# Patient Record
Sex: Male | Born: 1960 | Race: Black or African American | Hispanic: No | Marital: Married | Smoking: Never smoker
Health system: Southern US, Community
[De-identification: ages and names within clinical notes are randomized; demographics above are authoritative.]

---

## 2017-09-27 ENCOUNTER — Encounter (HOSPITAL_BASED_OUTPATIENT_CLINIC_OR_DEPARTMENT_OTHER): Payer: Self-pay | Admitting: *Deleted

## 2017-09-27 ENCOUNTER — Emergency Department (HOSPITAL_BASED_OUTPATIENT_CLINIC_OR_DEPARTMENT_OTHER)
Admission: EM | Admit: 2017-09-27 | Discharge: 2017-09-27 | Disposition: A | Discharge status: Home / Self Care | Payer: 59 | Attending: Emergency Medicine | Admitting: Emergency Medicine

## 2017-09-27 ENCOUNTER — Other Ambulatory Visit: Payer: Self-pay

## 2017-09-27 ENCOUNTER — Emergency Department (HOSPITAL_BASED_OUTPATIENT_CLINIC_OR_DEPARTMENT_OTHER): Payer: 59

## 2017-09-27 DIAGNOSIS — N200 Calculus of kidney: Secondary | ICD-10-CM | POA: Insufficient documentation

## 2017-09-27 DIAGNOSIS — R823 Hemoglobinuria: Secondary | ICD-10-CM | POA: Diagnosis present

## 2017-09-27 LAB — URINALYSIS, MICROSCOPIC (REFLEX): Squamous Epithelial / LPF: NONE SEEN

## 2017-09-27 LAB — URINALYSIS, ROUTINE W REFLEX MICROSCOPIC
Bilirubin Urine: NEGATIVE
Glucose, UA: NEGATIVE mg/dL
KETONES UR: 15 mg/dL — AB
LEUKOCYTES UA: NEGATIVE
NITRITE: NEGATIVE
PH: 6 (ref 5.0–8.0)
PROTEIN: 30 mg/dL — AB
Specific Gravity, Urine: >1.03 — ABNORMAL HIGH (ref 1.005–1.030)

## 2017-09-27 NOTE — ED Provider Notes (Signed)
[facility] EMERGENCY DEPARTMENT Provider Note   CSN: [id number] Arrival date & time: 09/27/17  1722     History   Chief Complaint Chief Complaint  Patient presents with  . Flank Pain    HPI Carlos Holloway is a 57 y.o. male.  Patient initially went to urgent care and was found to have a large amount of hemoglobin in his urine.  Pain was approximately an 8 out of 10.  However between urgent care and here the pain has significantly decreased.  Now only a 2 out of 10 and almost gone.   The history is provided by the patient.  Flank Pain  This is a new problem. Episode onset: started about 11am this morning. The problem occurs constantly. The problem has been resolved. Associated symptoms include abdominal pain. Associated symptoms comments: N/v.  No urinary symptoms.  No testicular pain.  No fever.  No diarrhea.. Nothing aggravates the symptoms. Nothing relieves the symptoms. He has tried nothing for the symptoms. The treatment provided significant relief.    History reviewed. No pertinent past medical history.  There are no active problems to display for this patient.   History reviewed. No pertinent surgical history.     Home Medications    Prior to Admission medications   Medication Sig Start Date End Date Taking? Authorizing Provider  tadalafil (CIALIS) 5 MG tablet Take 5 mg by mouth daily as needed for erectile dysfunction.   Yes [provider]    Family History No family history on file.  Social History Social History   Tobacco Use  . Smoking status: Never Smoker  . Smokeless tobacco: Never Used  Substance Use Topics  . Alcohol use: Yes    Frequency: Never    Comment: occasional  . Drug use: No     Allergies   Patient has no known allergies.   Review of Systems Review of Systems  Gastrointestinal: Positive for abdominal pain.  Genitourinary: Positive for flank pain.  All other systems reviewed and are negative.    Physical  Exam Updated Vital Signs BP (!) 158/77 (BP Location: Left Arm)   Pulse 94   Temp 98.2 F (36.8 C) (Oral)   Resp 20   Ht 5\' 8"  (1.727 m)   Wt 106.6 kg (235 lb)   SpO2 98%   BMI 35.73 kg/m   Physical Exam  Constitutional: He is oriented to person, place, and time. He appears well-developed and well-nourished. No distress.  HENT:  Head: Normocephalic and atraumatic.  Mouth/Throat: Oropharynx is clear and moist.  Eyes: Conjunctivae and EOM are normal. Pupils are equal, round, and reactive to light.  Neck: Normal range of motion. Neck supple.  Cardiovascular: Normal rate, regular rhythm and intact distal pulses.  No murmur heard. Pulmonary/Chest: Effort normal and breath sounds normal. No respiratory distress. He has no wheezes. He has no rales.  Abdominal: Soft. He exhibits no distension. There is no tenderness. There is no rebound, no guarding and no CVA tenderness.  Musculoskeletal: Normal range of motion. He exhibits no edema or tenderness.  Neurological: He is alert and oriented to person, place, and time.  Skin: Skin is warm and dry. No rash noted. No erythema.  Psychiatric: He has a normal mood and affect. His behavior is normal.  Nursing note and vitals reviewed.    ED Treatments / Results  Labs (all labs ordered are listed, but only abnormal results are displayed) Labs Reviewed  URINALYSIS, ROUTINE W REFLEX MICROSCOPIC - Abnormal; Notable  for the following components:      Result Value   Specific Gravity, Urine >1.030 (*)    Hgb urine dipstick LARGE (*)    Ketones, ur 15 (*)    Protein, ur 30 (*)    All other components within normal limits  URINALYSIS, MICROSCOPIC (REFLEX) - Abnormal; Notable for the following components:   Bacteria, UA MANY (*)    All other components within normal limits    EKG  EKG Interpretation None       Radiology Ct Renal Stone Study  Result Date: 09/27/2017 CLINICAL DATA:  Right flank pain since 10 a.m.  Vomiting. EXAM: CT  ABDOMEN AND PELVIS WITHOUT CONTRAST TECHNIQUE: Multidetector CT imaging of the abdomen and pelvis was performed following the standard protocol without IV contrast. COMPARISON:  None. FINDINGS: Lower chest:  No contributory findings. Hepatobiliary: Hepatic steatosis.No evidence of biliary obstruction or stone. Pancreas: Unremarkable. Spleen: Unremarkable. Adrenals/Urinary Tract: Negative adrenals. 2 mm stone layering in the urinary bladder. This is presumably passed from the right given the history and right perinephric and periureteric stranding. No hydronephrosis currently. No additional urolithiasis. Stomach/Bowel: No obstruction. No appendicitis. Mild distal colonic diverticulosis. Vascular/Lymphatic: No acute vascular abnormality. Atherosclerotic calcification. No mass or adenopathy. Reproductive:No pathologic findings. Nonspecific prostate calcifications. Other: No ascites or pneumoperitoneum. Musculoskeletal: No acute abnormalities. IMPRESSION: 1. 2 mm urinary bladder calculus with signs of recent passage from the right. Right pelviectasis is mild. 2.  Aortic Atherosclerosis (ICD10-I70.0). 3. Hepatic steatosis. 4. Distal colonic diverticulosis. Electronically Signed   By: Marnee Spring M.D.   On: 09/27/2017 18:58    Procedures Procedures (including critical care time)  Medications Ordered in ED Medications - No data to display   Initial Impression / Assessment and Plan / ED Course  I have reviewed the triage vital signs and the nursing notes.  Pertinent labs & imaging results that were available during my care of the patient were reviewed by me and considered in my medical decision making (see chart for details).     Pt with symptoms consistent with kidney stone.  Denies infectious sx, or GI symptoms.  Low concern for diverticulitis and no risk factors or history suggestive of AAA.  No hx suggestive of GU source (discharge) and otherwise pt is healthy.  Pain is almost completely resolved.   Will get CT to ensure stone has passed.  UA with blood but no signs of infection.Marland Kitchen  7:36 PM CT consistent with passed stone.  Pt sent home without complication.  Final Clinical Impressions(s) / ED Diagnoses   Final diagnoses:  Kidney stone    ED Discharge Orders    None       Gwyneth Sprout, MD 09/27/17 1936

## 2017-09-27 NOTE — ED Triage Notes (Signed)
Pt reports right flank pain since ~10am. Vomited x 7. Has UA from Urgent care that is +for large blood

## 2018-08-19 IMAGING — CT CT RENAL STONE PROTOCOL
2 of 4 series · 17 of 46 positions shown, 19 images · non-contrast
Comparison: None.

CLINICAL DATA: Right flank pain since 10 a.m..  Vomiting.

EXAM:
CT ABDOMEN AND PELVIS WITHOUT CONTRAST
TECHNIQUE: Multidetector CT imaging of the abdomen and pelvis was performed
following the standard protocol without IV contrast.

[Series 2: axial st · axial · 0.75mm/px · z∈[-496,-56]mm · 14 of 98 slices shown, 16 images]
[im 5/98  soft-tissue]
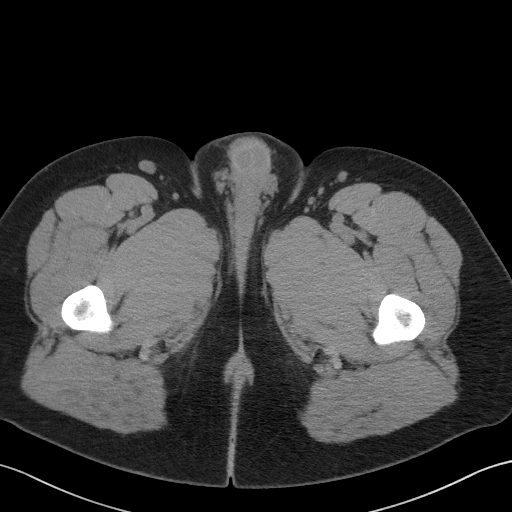
[im 5/98  bone]
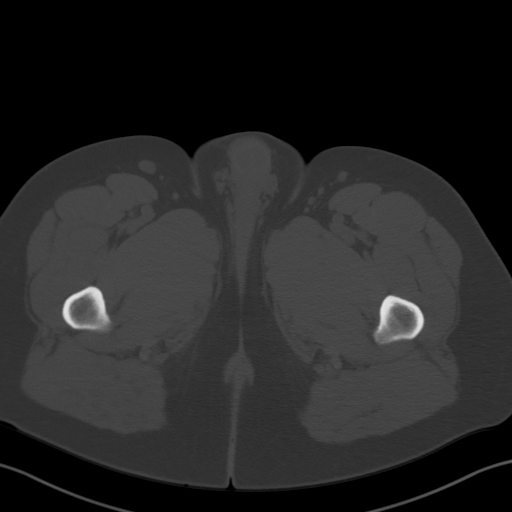
[im 13/98  soft-tissue]
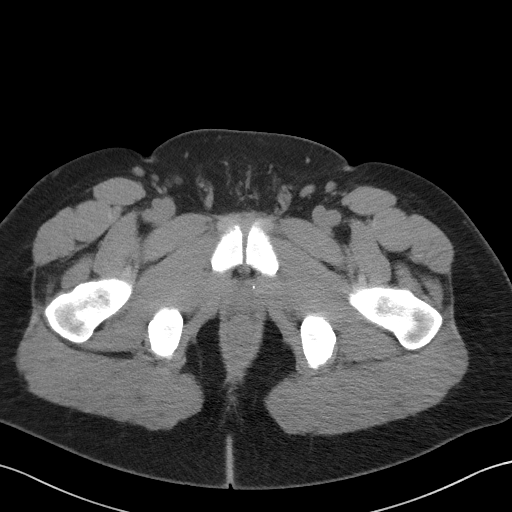
[im 21/98  soft-tissue]
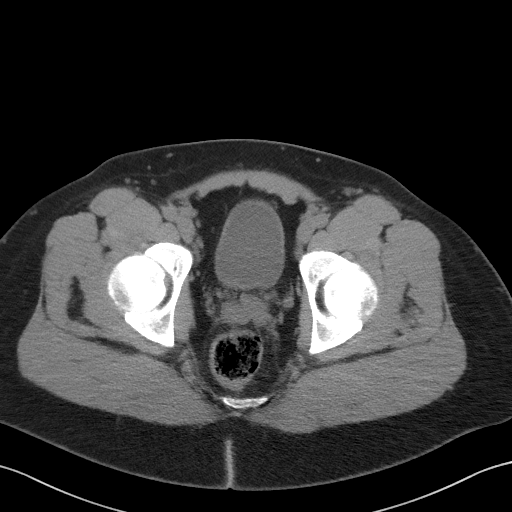
[im 25/98  soft-tissue]
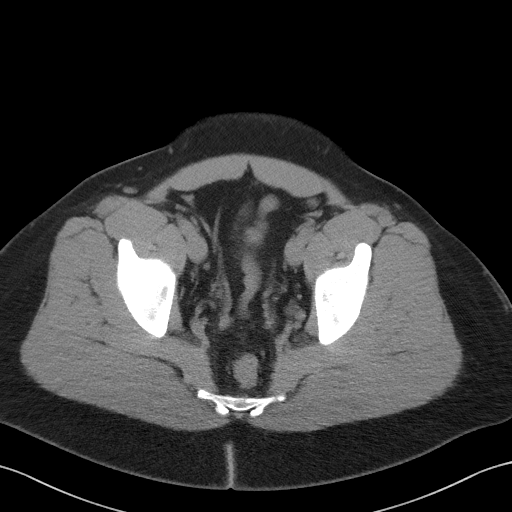
[im 33/98  soft-tissue]
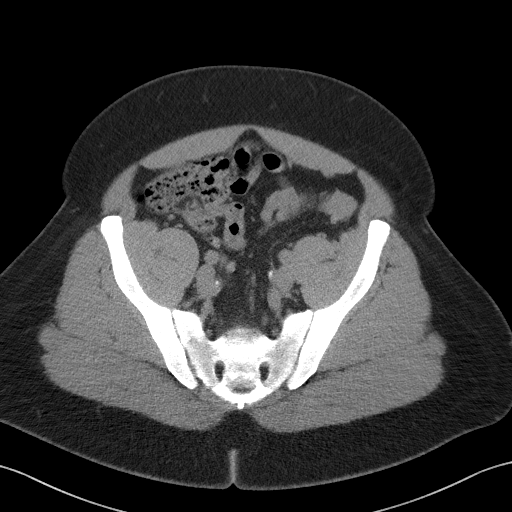
[im 41/98  soft-tissue]
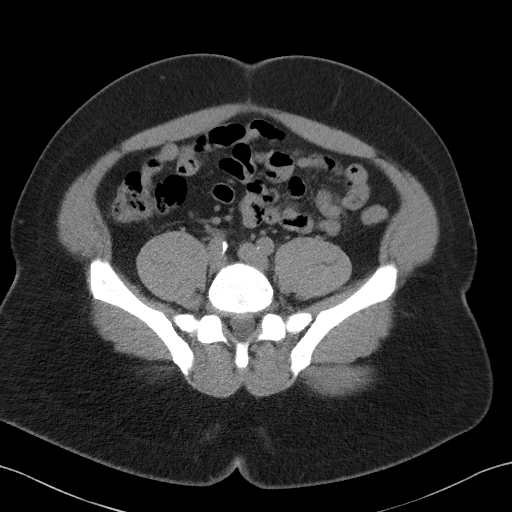
[im 45/98  soft-tissue]
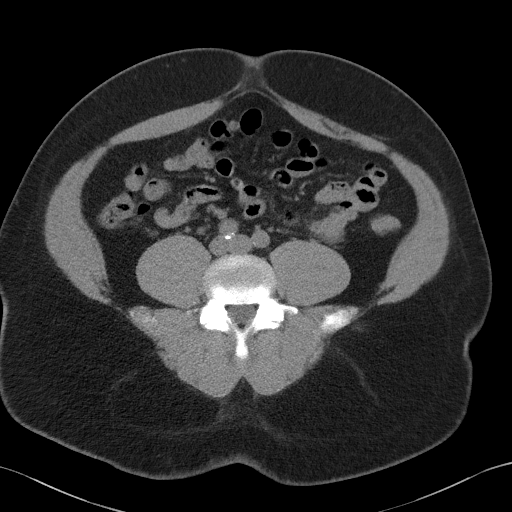
[im 53/98  soft-tissue]
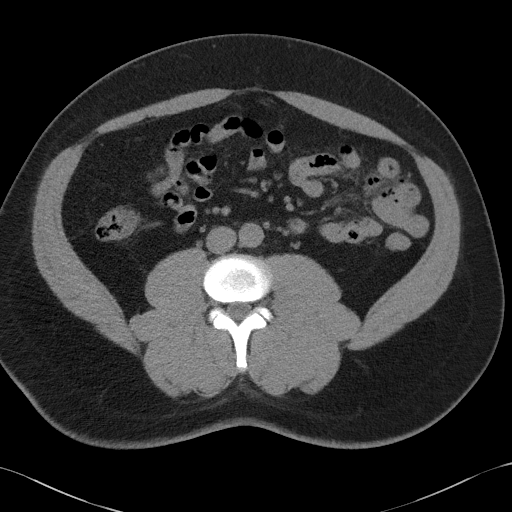
[im 57/98  soft-tissue]
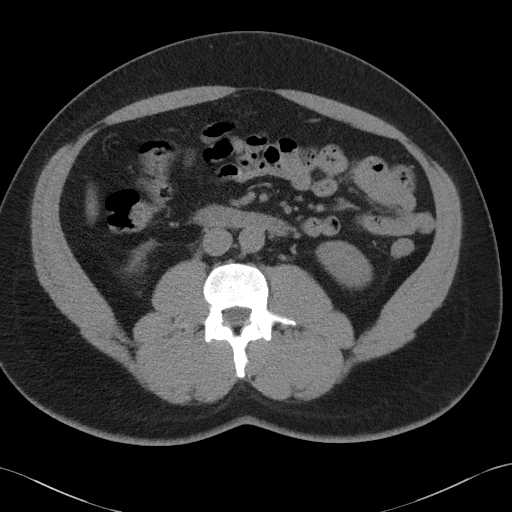
[im 57/98  bone]
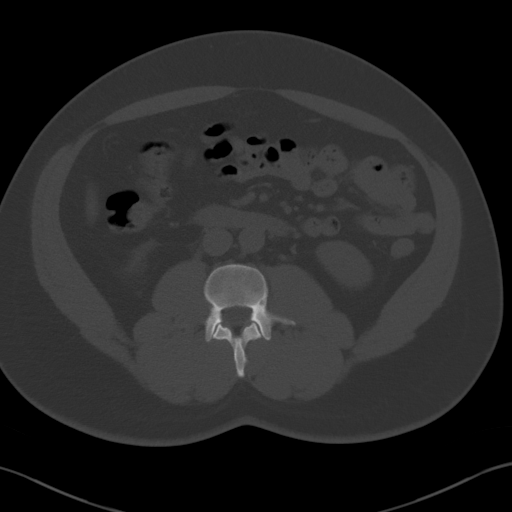
[im 65/98  soft-tissue]
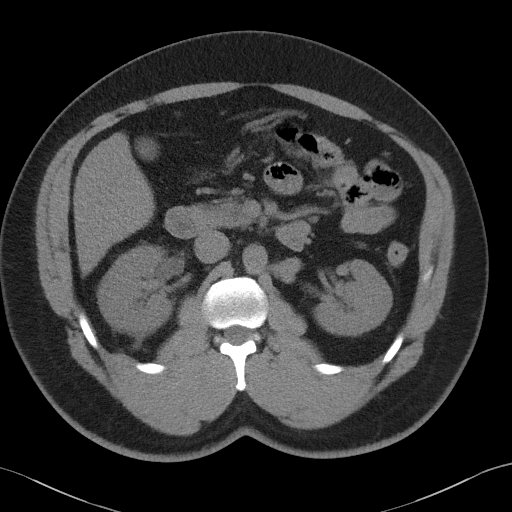
[im 73/98  soft-tissue]
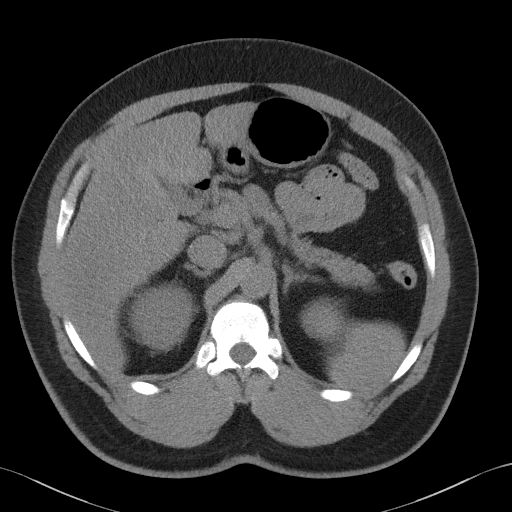
[im 77/98  soft-tissue]
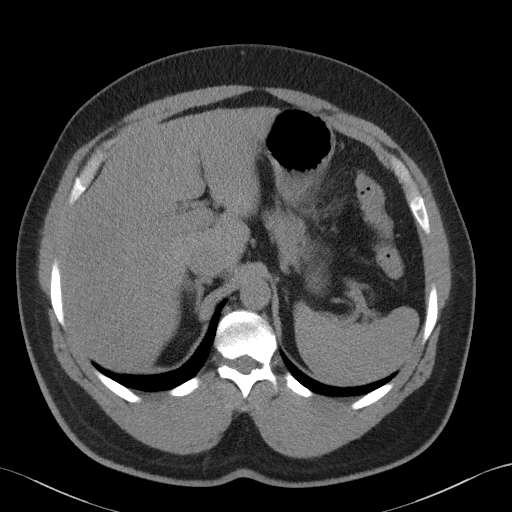
[im 85/98  soft-tissue]
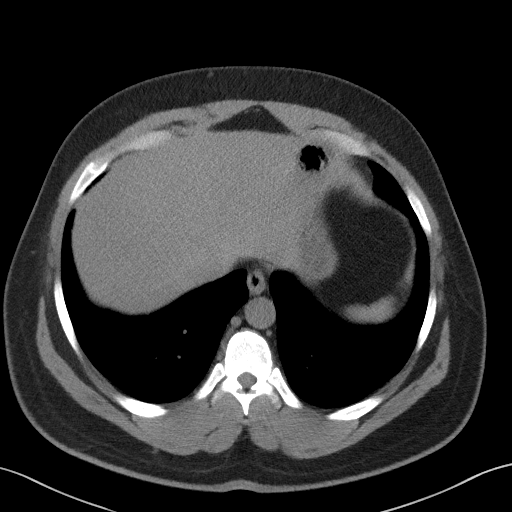
[im 93/98  soft-tissue]
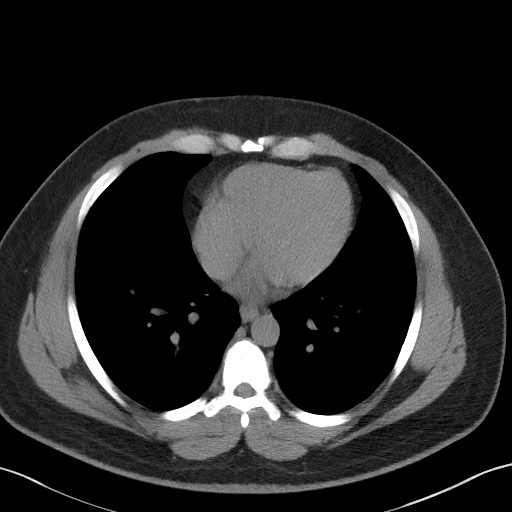

[Series 5: coronal st · coronal · 0.99mm/px · 3 of 101 slices shown]
[im 34/101  soft-tissue]
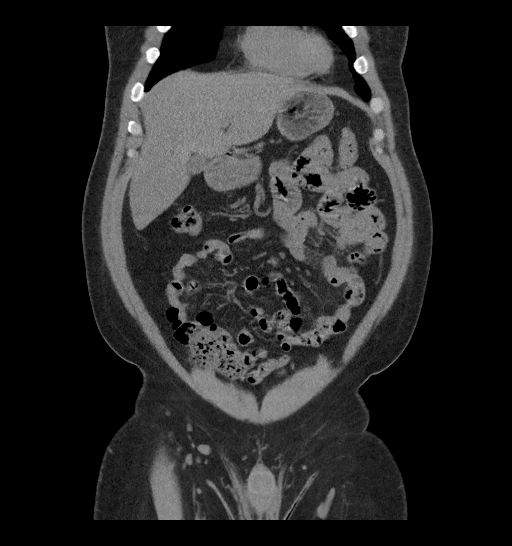
[im 45/101  soft-tissue]
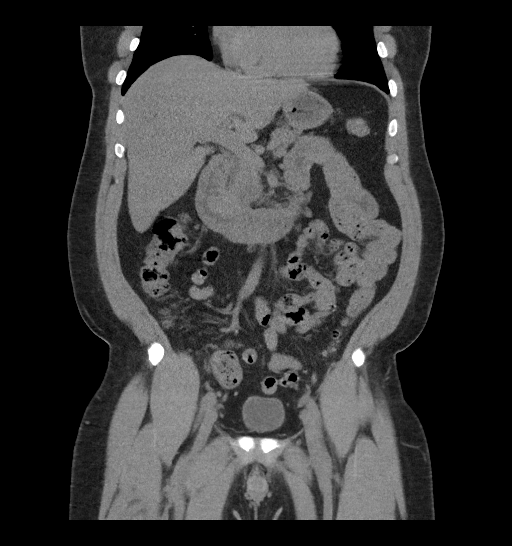
[im 56/101  soft-tissue]
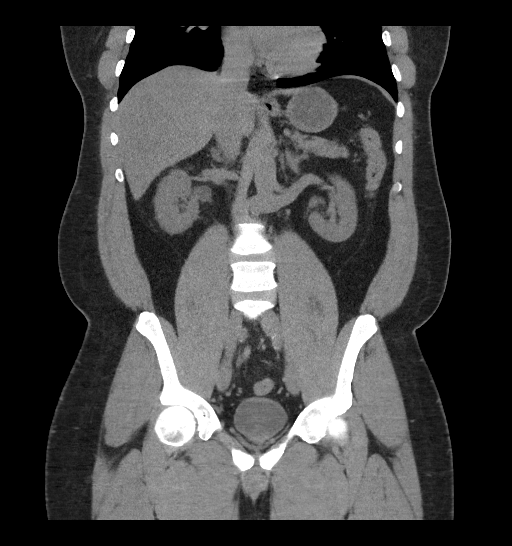

[17 of 46 positions shown; findings below may reference images not displayed]

FINDINGS: Lower chest:  No contributory findings.

Hepatobiliary: Hepatic steatosis.No evidence of biliary obstruction
or stone.

Pancreas: Unremarkable.

Spleen: Unremarkable.

Adrenals/Urinary Tract: Negative adrenals. 2 mm stone layering in
the urinary bladder. This is presumably passed from the right given
the history and right perinephric and periureteric stranding. No
hydronephrosis currently. No additional urolithiasis.

Stomach/Bowel: No obstruction. No appendicitis. Mild distal colonic
diverticulosis.

Vascular/Lymphatic: No acute vascular abnormality. Atherosclerotic
calcification. No mass or adenopathy.

Reproductive:No pathologic findings. Nonspecific prostate
calcifications.

Other: No ascites or pneumoperitoneum.

Musculoskeletal: No acute abnormalities.
IMPRESSION: 1. 2 mm urinary bladder calculus with signs of recent passage from
the right. Right pelviectasis is mild.
2.  Aortic Atherosclerosis ([id number]).
3. Hepatic steatosis.
4. Distal colonic diverticulosis.
